# Patient Record
Sex: Male | Born: 1965 | Marital: Married | Smoking: Never smoker
Health system: Southern US, Community
[De-identification: ages and names within clinical notes are randomized; demographics above are authoritative.]

---

## 2012-08-09 ENCOUNTER — Ambulatory Visit
Admission: RE | Admit: 2012-08-09 | Discharge: 2012-08-09 | Disposition: A | Discharge status: Home / Self Care | Payer: No Typology Code available for payment source | Source: Ambulatory Visit | Attending: Infectious Diseases | Admitting: Infectious Diseases

## 2012-08-09 ENCOUNTER — Other Ambulatory Visit: Payer: Self-pay | Admitting: Infectious Diseases

## 2012-08-09 DIAGNOSIS — R7611 Nonspecific reaction to tuberculin skin test without active tuberculosis: Secondary | ICD-10-CM

## 2014-01-25 IMAGING — CR DG CHEST 1V
1 series · 1 of 1 positions shown · non-contrast
Comparison: None.

CLINICAL DATA: Positive TB skin test.

CHEST - 1 VIEW

[view not recorded]
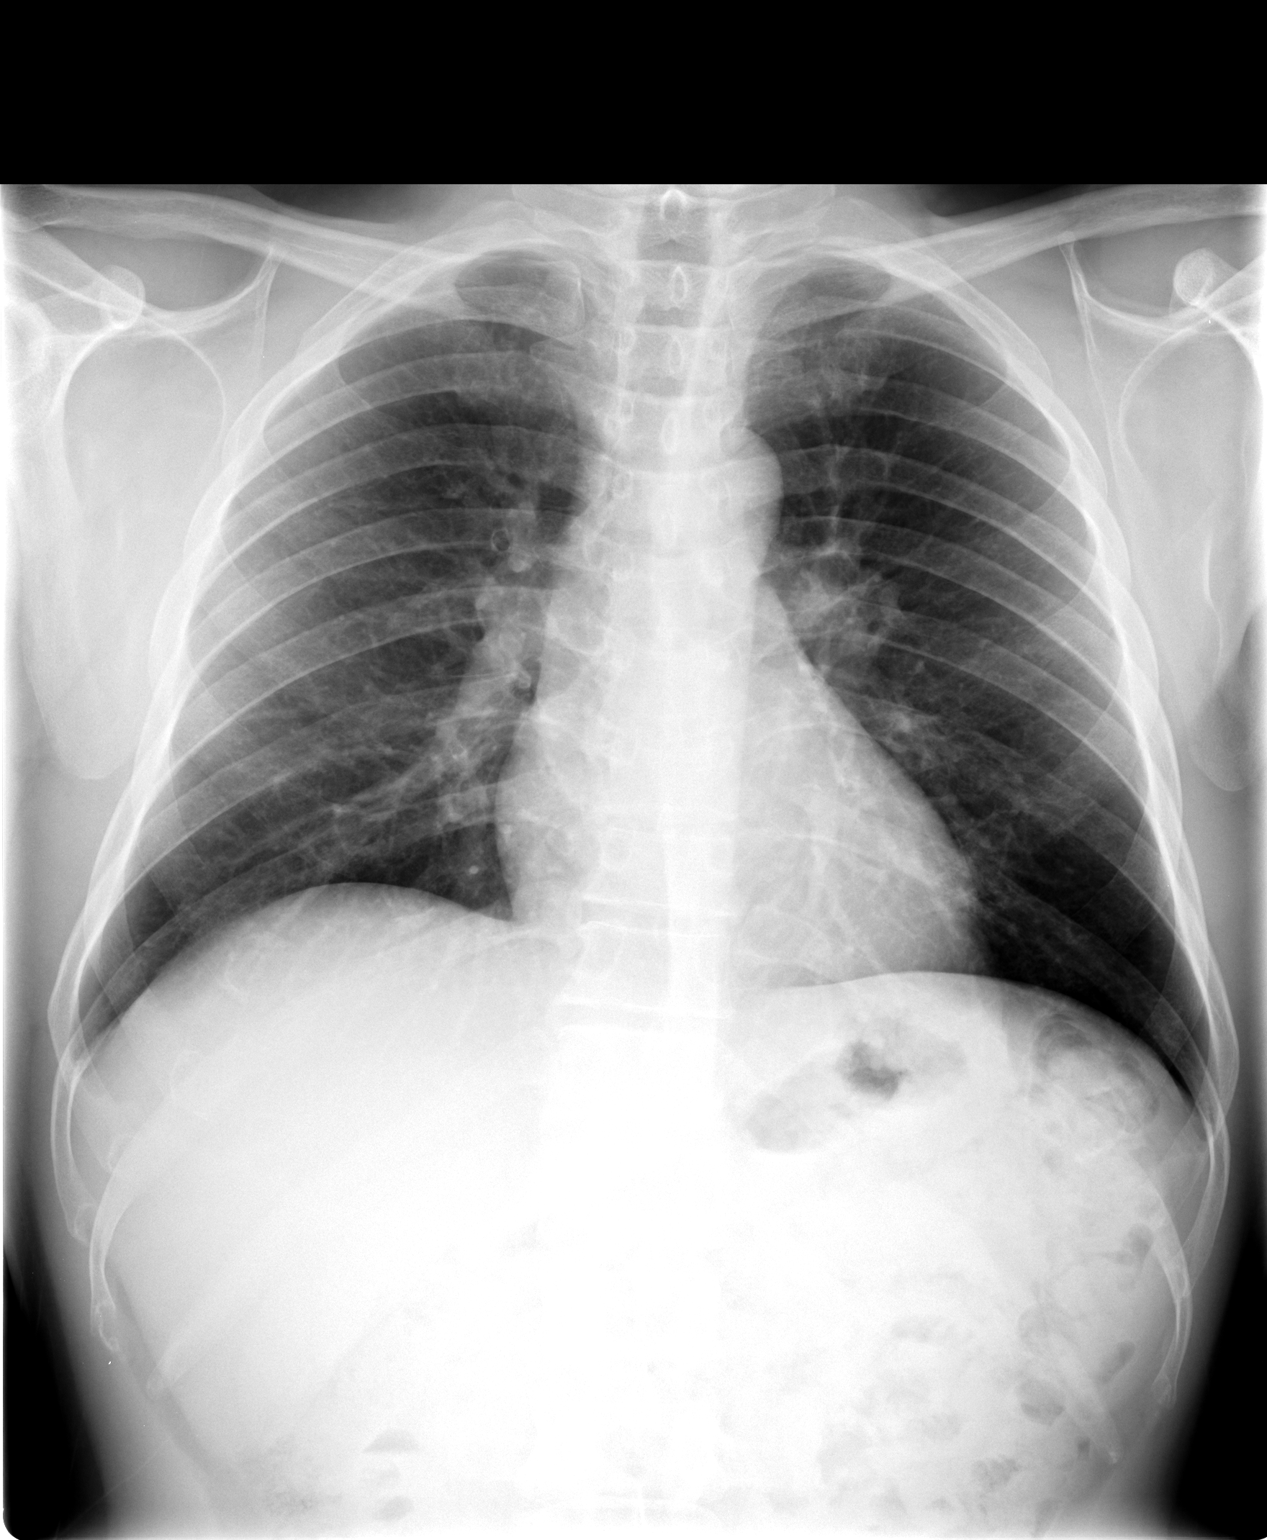

[1 of 1 positions shown; findings below may reference images not displayed]

FINDINGS: Trachea is midline.  Heart size normal.  Lungs are clear.
No pleural fluid.
IMPRESSION: No acute findings.  No evidence of active tuberculosis.

## 2014-04-01 ENCOUNTER — Ambulatory Visit (INDEPENDENT_AMBULATORY_CARE_PROVIDER_SITE_OTHER): Payer: BLUE CROSS/BLUE SHIELD | Admitting: Family Medicine

## 2014-04-01 VITALS — BP 110/84 | HR 73 | Temp 97.7°F | Ht 63.0 in | Wt 141.0 lb

## 2014-04-01 DIAGNOSIS — Z13220 Encounter for screening for lipoid disorders: Secondary | ICD-10-CM

## 2014-04-01 DIAGNOSIS — Z125 Encounter for screening for malignant neoplasm of prostate: Secondary | ICD-10-CM

## 2014-04-01 DIAGNOSIS — Z23 Encounter for immunization: Secondary | ICD-10-CM

## 2014-04-01 DIAGNOSIS — J028 Acute pharyngitis due to other specified organisms: Secondary | ICD-10-CM

## 2014-04-01 DIAGNOSIS — R059 Cough, unspecified: Secondary | ICD-10-CM

## 2014-04-01 DIAGNOSIS — Z0000 Encounter for general adult medical examination without abnormal findings: Secondary | ICD-10-CM

## 2014-04-01 DIAGNOSIS — R05 Cough: Secondary | ICD-10-CM

## 2014-04-01 DIAGNOSIS — Z130 Encounter for screening for diseases of the blood and blood-forming organs and certain disorders involving the immune mechanism: Secondary | ICD-10-CM

## 2014-04-01 DIAGNOSIS — R748 Abnormal levels of other serum enzymes: Secondary | ICD-10-CM

## 2014-04-01 LAB — COMPREHENSIVE METABOLIC PANEL
ALBUMIN: 4.3 g/dL (ref 3.5–5.2)
ALK PHOS: 52 U/L (ref 39–117)
ALT: 26 U/L (ref 0–53)
AST: 16 U/L (ref 0–37)
BUN: 17 mg/dL (ref 6–23)
CHLORIDE: 105 meq/L (ref 96–112)
CO2: 27 meq/L (ref 19–32)
CREATININE: 0.72 mg/dL (ref 0.50–1.35)
Calcium: 9.6 mg/dL (ref 8.4–10.5)
Glucose, Bld: 103 mg/dL — ABNORMAL HIGH (ref 70–99)
POTASSIUM: 4.2 meq/L (ref 3.5–5.3)
Sodium: 140 mEq/L (ref 135–145)
TOTAL PROTEIN: 7.5 g/dL (ref 6.0–8.3)
Total Bilirubin: 0.6 mg/dL (ref 0.2–1.2)

## 2014-04-01 LAB — LIPID PANEL
CHOL/HDL RATIO: 3.4 ratio
Cholesterol: 185 mg/dL (ref 0–200)
HDL: 54 mg/dL (ref >39)
LDL Cholesterol: 114 mg/dL — ABNORMAL HIGH (ref 0–99)
TRIGLYCERIDES: 84 mg/dL (ref <150)
VLDL: 17 mg/dL (ref 0–40)

## 2014-04-01 LAB — CBC
HCT: 49.4 % (ref 39.0–52.0)
Hemoglobin: 17.2 g/dL — ABNORMAL HIGH (ref 13.0–17.0)
MCH: 28.4 pg (ref 26.0–34.0)
MCHC: 34.8 g/dL (ref 30.0–36.0)
MCV: 81.5 fL (ref 78.0–100.0)
MPV: 9 fL (ref 8.6–12.4)
Platelets: 314 10*3/uL (ref 150–400)
RBC: 6.06 MIL/uL — AB (ref 4.22–5.81)
RDW: 13.3 % (ref 11.5–15.5)
WBC: 6.9 10*3/uL (ref 4.0–10.5)

## 2014-04-01 MED ORDER — HYDROCODONE-HOMATROPINE 5-1.5 MG/5ML PO SYRP: 5.0000 mL | ORAL_SOLUTION | Freq: Three times a day (TID) | ORAL | Status: DC | PRN

## 2014-04-01 NOTE — Progress Notes (Signed)
[facility] [address] [place] [address] [phone]  Date:  04/01/2014   Name:  Keith Martin   DOB:  Jul 11, 1965   MRN:  [id number]  PCP:  No primary care provider on file.    Chief Complaint: Annual Exam; Cough; and Sore Throat   History of Present Illness:  Keith Martin is a 49 y.o. very pleasant male patient who presents with the following:  Here today as a new patient seeking a CPE and also with complaint of sore throat. He is fasting today for labs.  He has noted a mild sore throat and cough for about 3 days. No fever.  The cough is bothersome because he has to leave his bedroom to avoid disturbing his sleeping bed partner He is generally in good health.   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  He does not do a lot of exercise in the winter, but his job is active He has kept up with his physical exams elsewhere in the past and reports that he has had elevated liver function in the past. He is not aware of having had a hepatitis screening.   He did have a colonoscopy per Dr. Elnoria Howard at age 56 due to bleeding; he recently received a reminder to come in and have a repeat colon Tetanus shot done approx 2008  There are no active problems to display for this patient.   History reviewed. No pertinent past medical history.  History reviewed. No pertinent past surgical history.  History  Substance Use Topics  . Smoking status: Never Smoker   . Smokeless tobacco: Not on file  . Alcohol Use: Not on file    History reviewed. No pertinent family history.  Allergies not on file  Medication list has been reviewed and updated.  No current outpatient prescriptions on file prior to visit.   No current facility-administered medications on file prior to visit.    Review of Systems:  As per HPI- otherwise negative.   Physical Examination: Filed Vitals:   04/01/14 1033  BP: 110/84  Pulse: 73  Temp: 97.7 F (36.5 C)   Filed Vitals:   04/01/14  1033  Height:  (1.6 m)  Weight: 141 lb (63.957 kg)   Body mass index is 24.98 kg/(m^2). Ideal Body Weight: Weight in (lb) to have BMI = 25: 140.8  GEN: WDWN, NAD, Non-toxic, A & O x 3 HEENT: Atraumatic, Normocephalic. Neck supple. No masses, No LAD.  Bilateral TM wnl, oropharynx shows a tiny ulcer on the posterior oropharynx, nasal cavity is congested.  PEERL,EOMI.   Ears and Nose: No external deformity. CV: RRR, No M/G/R. No JVD. No thrill. No extra heart sounds. PULM: CTA B, no wheezes, crackles, rhonchi. No retractions. No resp. distress. No accessory muscle use. ABD: S, NT, ND. No rebound. No HSM. EXTR: No c/c/e NEURO Normal gait.  PSYCH: Normally interactive. Conversant. Not depressed or anxious appearing.  Calm demeanor.  Normal testes, penis and DRE   Assessment and Plan: Physical exam  Acute pharyngitis due to other specified organisms  Cough - Plan: HYDROcodone-homatropine (HYCODAN) 5-1.5 MG/5ML syrup  Immunization due - Plan: Flu Vaccine QUAD 36+ mos IM  Screening for prostate cancer - Plan: PSA  Screening for hyperlipidemia - Plan: Lipid panel  Screening for deficiency anemia - Plan: CBC  Liver enzyme elevation - Plan: Comprehensive metabolic panel, Acute Hep Panel & Hep B Surface Ab  New patient here today for a CPE.  Appears to be in good health.   Treat for his viral cough/ ST with OTC medications and hycodan as needed.  Asked to let me know if not better in a few days Will plan further follow- up pending labs.   Signed Abbe AmsterdamJessica Dariah Mcsorley, MD

## 2014-04-01 NOTE — Patient Instructions (Addendum)
Good to see you today!  I will be in touch with your labs.   I think that you have a viral sore throat/ upper respiratory infection. Use over the counter medications as needed You can also use the cough syrup as needed but remember it can make you feel sleepy- do not use when you are driving We gave you your flu shot today I would recommend that you go ahead and get a follow-up colonoscopy in the next year or so  Influenza Vaccine (Flu Vaccine, Inactivated or Recombinant) 2014-2015: What You Need to Know 1. Why get vaccinated? Influenza ("flu") is a contagious disease that spreads around the [place] every winter, usually between October and May. Flu is caused by influenza viruses, and is spread mainly by coughing, sneezing, and close contact. Anyone can get flu, but the risk of getting flu is highest among children. Symptoms come on suddenly and may last several days. They can include:  fever/chills  sore throat  muscle aches  fatigue  cough  headache  runny or stuffy nose Flu can make some people much sicker than others. These people include young children, people 48 and older, pregnant women, and people with certain health conditions-such as heart, lung or kidney disease, nervous system disorders, or a weakened immune system. Flu vaccination is especially important for these people, and anyone in close contact with them. Flu can also lead to pneumonia, and make existing medical conditions worse. It can cause diarrhea and seizures in children. Each year thousands of people in the [place] die from flu, and many more are hospitalized. Flu vaccine is the best protection against flu and its complications. Flu vaccine also helps prevent spreading flu from person to person. 2. Inactivated and recombinant flu vaccines You are getting an injectable flu vaccine, which is either an "inactivated" or "recombinant" vaccine. These vaccines do not contain any live influenza virus. They  are given by injection with a needle, and often called the "flu shot."  A different live, attenuated (weakened) influenza vaccine is sprayed into the nostrils. This vaccine is described in a separate Vaccine Information Statement. Flu vaccination is recommended every year. Some children 6 months through 34 years of age might need two doses during one year. Flu viruses are always changing. Each year's flu vaccine is made to protect against 3 or 4 viruses that are likely to cause disease that year. Flu vaccine cannot prevent all cases of flu, but it is the best defense against the disease.  It takes about 2 weeks for protection to develop after the vaccination, and protection lasts several months to a year. Some illnesses that are not caused by influenza virus are often mistaken for flu. Flu vaccine will not prevent these illnesses. It can only prevent influenza. Some inactivated flu vaccine contains a very small amount of a mercury-based preservative called thimerosal. Studies have shown that thimerosal in vaccines is not harmful, but flu vaccines that do not contain a preservative are available. 3. Some people should not get this vaccine Tell the person who gives you the vaccine:  If you have any severe, life-threatening allergies. If you ever had a life-threatening allergic reaction after a dose of flu vaccine, or have a severe allergy to any part of this vaccine, including (for example) an allergy to gelatin, antibiotics, or eggs, you may be advised not to get vaccinated. Most, but not all, types of flu vaccine contain a small amount of egg protein.  If you ever had Guillain-Barr  Syndrome (a severe paralyzing illness, also called GBS). Some people with a history of GBS should not get this vaccine. This should be discussed with your doctor.  If you are not feeling well. It is usually okay to get flu vaccine when you have a mild illness, but you might be advised to wait until you feel better. You  should come back when you are better. 4. Risks of a vaccine reaction With a vaccine, like any medicine, there is a chance of side effects. These are usually mild and go away on their own. Problems that could happen after any vaccine:  Brief fainting spells can happen after any medical procedure, including vaccination. Sitting or lying down for about 15 minutes can help prevent fainting, and injuries caused by a fall. Tell your doctor if you feel dizzy, or have vision changes or ringing in the ears.  Severe shoulder pain and reduced range of motion in the arm where a shot was given can happen, very rarely, after a vaccination.  Severe allergic reactions from a vaccine are very rare, estimated at less than 1 in a million doses. If one were to occur, it would usually be within a few minutes to a few hours after the vaccination. Mild problems following inactivated flu vaccine:  soreness, redness, or swelling where the shot was given  hoarseness  sore, red or itchy eyes  cough  fever  aches  headache  itching  fatigue If these problems occur, they usually begin soon after the shot and last 1 or 2 days. Moderate problems following inactivated flu vaccine:  Young children who get inactivated flu vaccine and pneumococcal vaccine (PCV13) at the same time may be at increased risk for seizures caused by fever. Ask your doctor for more information. Tell your doctor if a child who is getting flu vaccine has ever had a seizure. Inactivated flu vaccine does not contain live flu virus, so you cannot get the flu from this vaccine. As with any medicine, there is a very remote chance of a vaccine causing a serious injury or death. The safety of vaccines is always being monitored. For more information, visit: http://floyd.org/www.cdc.gov/vaccinesafety/ 5. What if there is a serious reaction? What should I look for?  Look for anything that concerns you, such as signs of a severe allergic reaction, very high fever,  or behavior changes. Signs of a severe allergic reaction can include hives, swelling of the face and throat, difficulty breathing, a fast heartbeat, dizziness, and weakness. These would start a few minutes to a few hours after the vaccination. What should I do?  If you think it is a severe allergic reaction or other emergency that can't wait, call 9-1-1 and get the person to the nearest hospital. Otherwise, call your doctor.  Afterward, the reaction should be reported to the Vaccine Adverse Event Reporting System (VAERS). Your doctor should file this report, or you can do it yourself through the VAERS web site at www.vaers.LAgents.nohhs.gov, or by calling 1-(647) 209-9980. VAERS does not give medical advice. 6. The National Vaccine Injury Compensation Program The [employer] Vaccine Injury Compensation Program (VICP) is a federal program that was created to compensate people who may have been injured by certain vaccines. Persons who believe they may have been injured by a vaccine can learn about the program and about filing a claim by calling 1-725-538-7278 or visiting the VICP website at SpiritualWord.atwww.hrsa.gov/vaccinecompensation. There is a time limit to file a claim for compensation. 7. How can I learn more?  Ask your health care provider.  Call your local or state health department.  Contact the Centers for Disease Control and Prevention (CDC):  Call 530-048-6652 (1-800-CDC-INFO) or  Visit CDC's website at https://gibson.com/ CDC Vaccine Information Statement (Interim) Inactivated Influenza Vaccine (10/05/2012) Document Released: 11/28/2005 Document Revised: 06/20/2013 Document Reviewed: 01/21/2013 [facility] Patient Information 2015 [place]. This information is not intended to replace advice given to you by your health care provider. Make sure you discuss any questions you have with your health care provider.

## 2014-04-02 LAB — ACUTE HEP PANEL AND HEP B SURFACE AB
HCV Ab: NEGATIVE
HEP B S AB: POSITIVE — AB
HEP B S AG: NEGATIVE
Hep A IgM: NONREACTIVE
Hep B C IgM: NONREACTIVE

## 2014-04-03 LAB — PSA: PSA: 1.14 ng/mL (ref <=4.00)

## 2014-04-09 ENCOUNTER — Encounter: Payer: Self-pay | Admitting: Family Medicine

## 2015-06-23 ENCOUNTER — Ambulatory Visit (INDEPENDENT_AMBULATORY_CARE_PROVIDER_SITE_OTHER): Payer: BLUE CROSS/BLUE SHIELD | Admitting: Physician Assistant

## 2015-06-23 ENCOUNTER — Ambulatory Visit (INDEPENDENT_AMBULATORY_CARE_PROVIDER_SITE_OTHER): Payer: BLUE CROSS/BLUE SHIELD

## 2015-06-23 VITALS — BP 110/72 | HR 71 | Temp 98.0°F | Resp 16 | Ht 63.0 in | Wt 137.6 lb

## 2015-06-23 DIAGNOSIS — H9193 Unspecified hearing loss, bilateral: Secondary | ICD-10-CM | POA: Diagnosis not present

## 2015-06-23 DIAGNOSIS — M25511 Pain in right shoulder: Secondary | ICD-10-CM | POA: Diagnosis not present

## 2015-06-23 DIAGNOSIS — H9313 Tinnitus, bilateral: Secondary | ICD-10-CM

## 2015-06-23 DIAGNOSIS — S4362XA Sprain of left sternoclavicular joint, initial encounter: Secondary | ICD-10-CM

## 2015-06-23 MED ORDER — DICLOFENAC SODIUM 75 MG PO TBEC: 75.0000 mg | DELAYED_RELEASE_TABLET | Freq: Two times a day (BID) | ORAL | Status: AC

## 2015-06-23 NOTE — Progress Notes (Signed)
Nehemiah SettleDavid Hing  MRN: [id number] DOB: [birth year]  Subjective:  Pt presents to clinic 2 complaints 1- right ear tinnitus and decreased hearing in that ear for about 2 months - he has used the ear wax removal drops at home and his symptoms continue.  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  2- left shoulder and clavicle pain for about 2 weeks - he lifts heavy things at work and he is having pain with lifting.  He has some pain in his trapezius area after lifting a lot of heavy items.  He takes glucosamine chondroitin but it has not been helping this pain.  He has no paresthesias or pain down his left arm.  There are no active problems to display for this patient.   Current Outpatient Prescriptions on File Prior to Visit  Medication Sig Dispense Refill  . multivitamin-lutein (OCUVITE-LUTEIN) CAPS capsule Take 1 capsule by mouth daily.     No current facility-administered medications on file prior to visit.    No Known Allergies  Review of Systems  Constitutional: Negative for fever and chills.  HENT: Positive for hearing loss (1-2 months ago). Negative for ear discharge, ear pain and sore throat.   Respiratory: Negative for cough.   Allergic/Immunologic: Negative for environmental allergies.   Objective:  BP 110/72 mmHg  Pulse 71  Temp(Src) 98 F (36.7 C) (Oral)  Resp 16  Ht 5\' 3"  (1.6 m)  Wt 137 lb 9.6 oz (62.415 kg)  BMI 24.38 kg/m2  SpO2 97%  Physical Exam  Constitutional: He is oriented to person, place, and time and well-developed, well-nourished, and in no distress.  HENT:  Head: Normocephalic and atraumatic.  Right Ear: Hearing, tympanic membrane, external ear and ear canal normal.  Left Ear: Hearing, tympanic membrane, external ear and ear canal normal.  Nose: Nose normal.  Mouth/Throat: Uvula is midline, oropharynx is clear and moist and mucous membranes are  normal.  Fahim Kats - R=L Rinne - AC>BC B  Eyes: Conjunctivae are normal.  Neck: Normal range of motion.  Cardiovascular: Normal rate, regular rhythm and normal heart sounds.   Pulmonary/Chest: Effort normal and breath sounds normal. He has no wheezes.  Musculoskeletal:       Right shoulder: Normal.       Left shoulder: He exhibits tenderness ([place] joint TTP), pain and spasm (trapezius spasm with some TTP). He exhibits normal range of motion.  Lymphadenopathy:       Head (right side): No tonsillar adenopathy present.       Head (left side): No tonsillar adenopathy present.    He has no cervical adenopathy.       Right: No supraclavicular adenopathy present.       Left: No supraclavicular adenopathy present.  Neurological: He is alert and oriented to person, place, and time. Gait normal.  Skin: Skin is warm and dry.  Psychiatric: Mood, memory, affect and judgment normal.   [facility]  06/23/2015  CLINICAL DATA:  Left [place] joint pain for several weeks. EXAM: STENOCLAVICULAR JOINTS - 3+ VIEW COMPARISON:  None. FINDINGS: The sternoclavicular joints appear symmetric bilaterally. No destructive bony changes or erosive findings. No significant spurring changes. The visualized lungs are clear. IMPRESSION: Unremarkable plain film examination of the sternoclavicular joints. If symptoms persist MR may be helpful to evaluate for [place] joint arthropathy. Electronically Signed   By: Demetrius CharityP.  Gallerani M.D.   On: 06/23/2015 16:48    Assessment and Plan :  Hearing loss, bilateral - Plan: Ambulatory referral to Audiology - his hearing tests today were normal so he may have loud noise hearing loss bilaterally leading to tinnitus - we will have audiology evaluate the patient   Tinnitus, bilateral - Plan: Ambulatory referral to Audiology  Clavicle pain, right - Plan: [facility] [place] [facility]  Sternoclavicular (joint) (ligament) sprain, left, initial encounter - Plan: diclofenac (VOLTAREN) 75 MG EC tablet - this is likely from  lifting at work and with ice and NSAIDs this should resolve.  He will recheck if he has not improved in the next 2 weeks.  Benny Lennert PA-C  [facility] 06/23/2015 4:51 PM

## 2015-06-23 NOTE — Patient Instructions (Addendum)
  Ice to the area that hurts and use the medications that I gave you for a week and then just as needed.  They will call you with an appt for you hearing loss and ringing in your ears.   IF you received an x-ray today, you will receive an invoice from [facility]. Please contact [facility] at [phone] with questions or concerns regarding your invoice.   IF you received labwork today, you will receive an invoice from [facility]. Please contact [facility] at [phone] with questions or concerns regarding your invoice.   Our billing staff will not be able to assist you with questions regarding bills from these companies.  You will be contacted with the lab results as soon as they are available. The fastest way to get your results is to activate your My Chart account. Instructions are located on the last page of this paperwork. If you have not heard from us regarding the results in 2 weeks, please contact this office.

## 2015-07-26 ENCOUNTER — Encounter: Payer: Self-pay | Admitting: Physician Assistant

## 2015-07-26 DIAGNOSIS — H9193 Unspecified hearing loss, bilateral: Secondary | ICD-10-CM | POA: Insufficient documentation

## 2016-05-23 ENCOUNTER — Encounter: Payer: Self-pay | Admitting: Physician Assistant

## 2016-06-11 ENCOUNTER — Encounter: Payer: Self-pay | Admitting: Physician Assistant

## 2016-06-11 DIAGNOSIS — K635 Polyp of colon: Secondary | ICD-10-CM | POA: Insufficient documentation

## 2016-12-08 IMAGING — CR DG SC JOINTS 3+V
3 series · 3 of 3 positions shown · non-contrast
Comparison: None.

CLINICAL DATA: Left [place] joint pain for several weeks.

EXAM:
STENOCLAVICULAR JOINTS - 3+ VIEW

[PA]
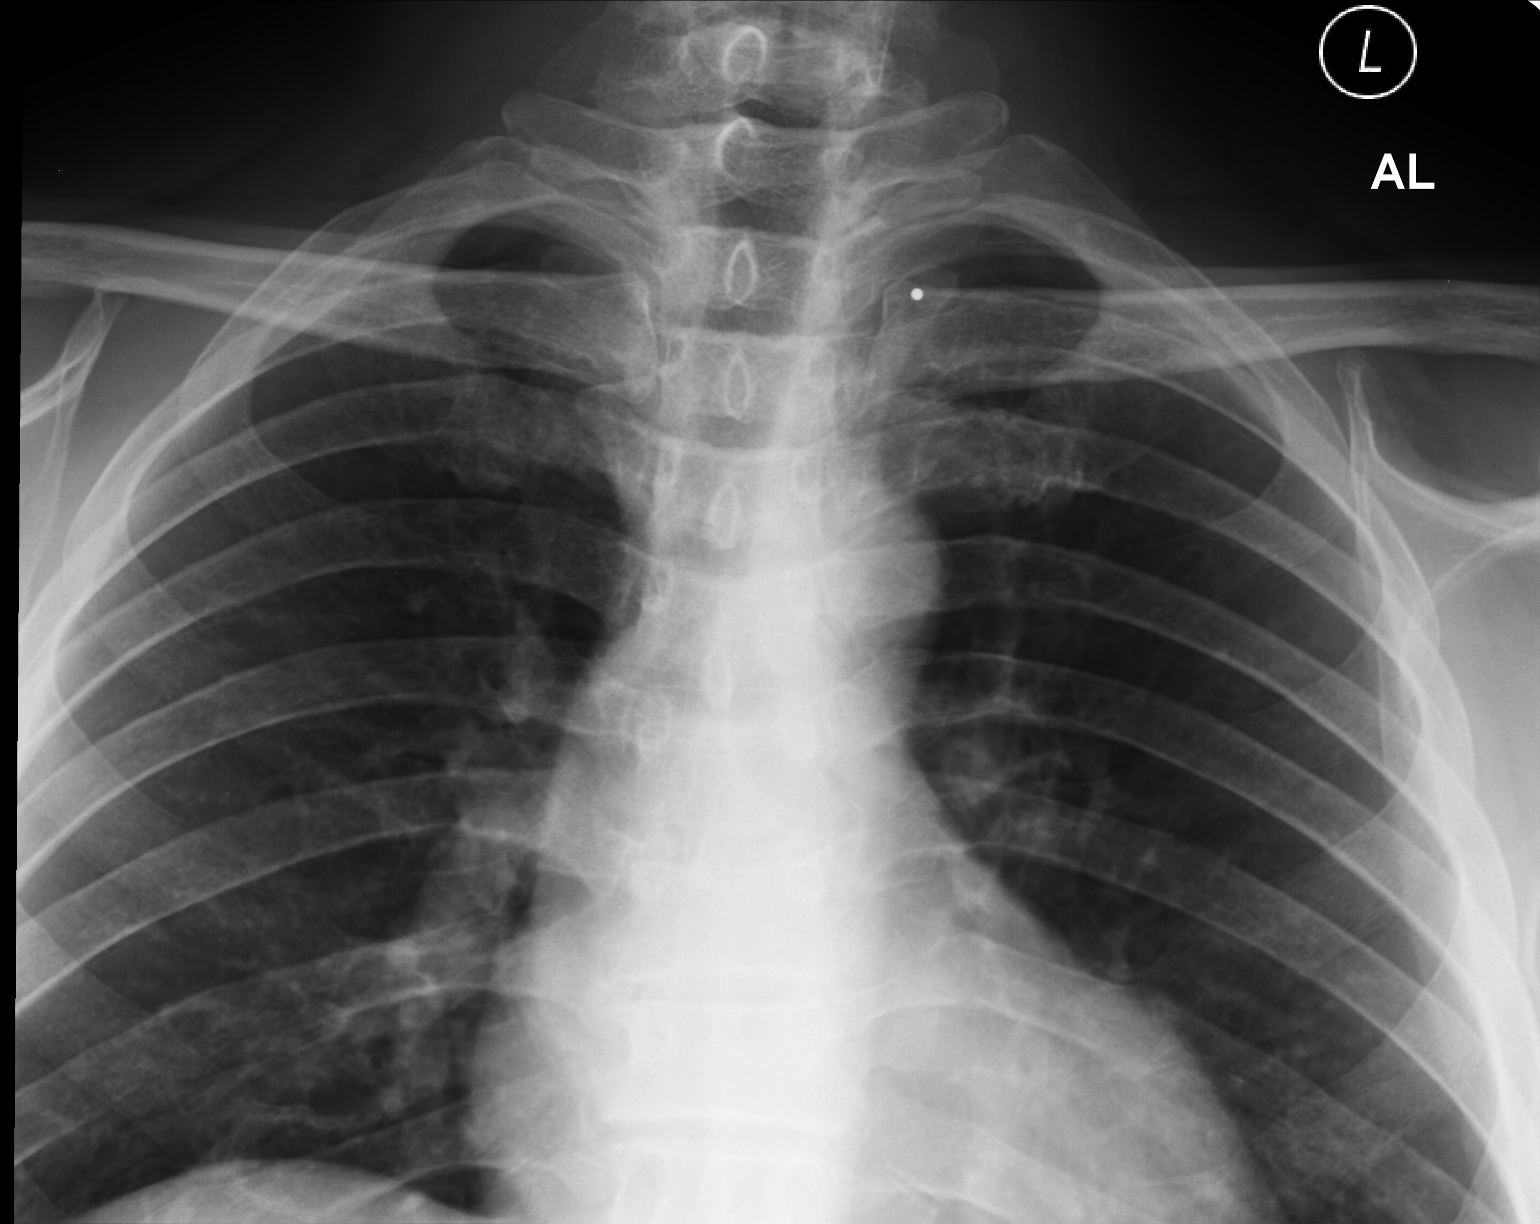

[rao]
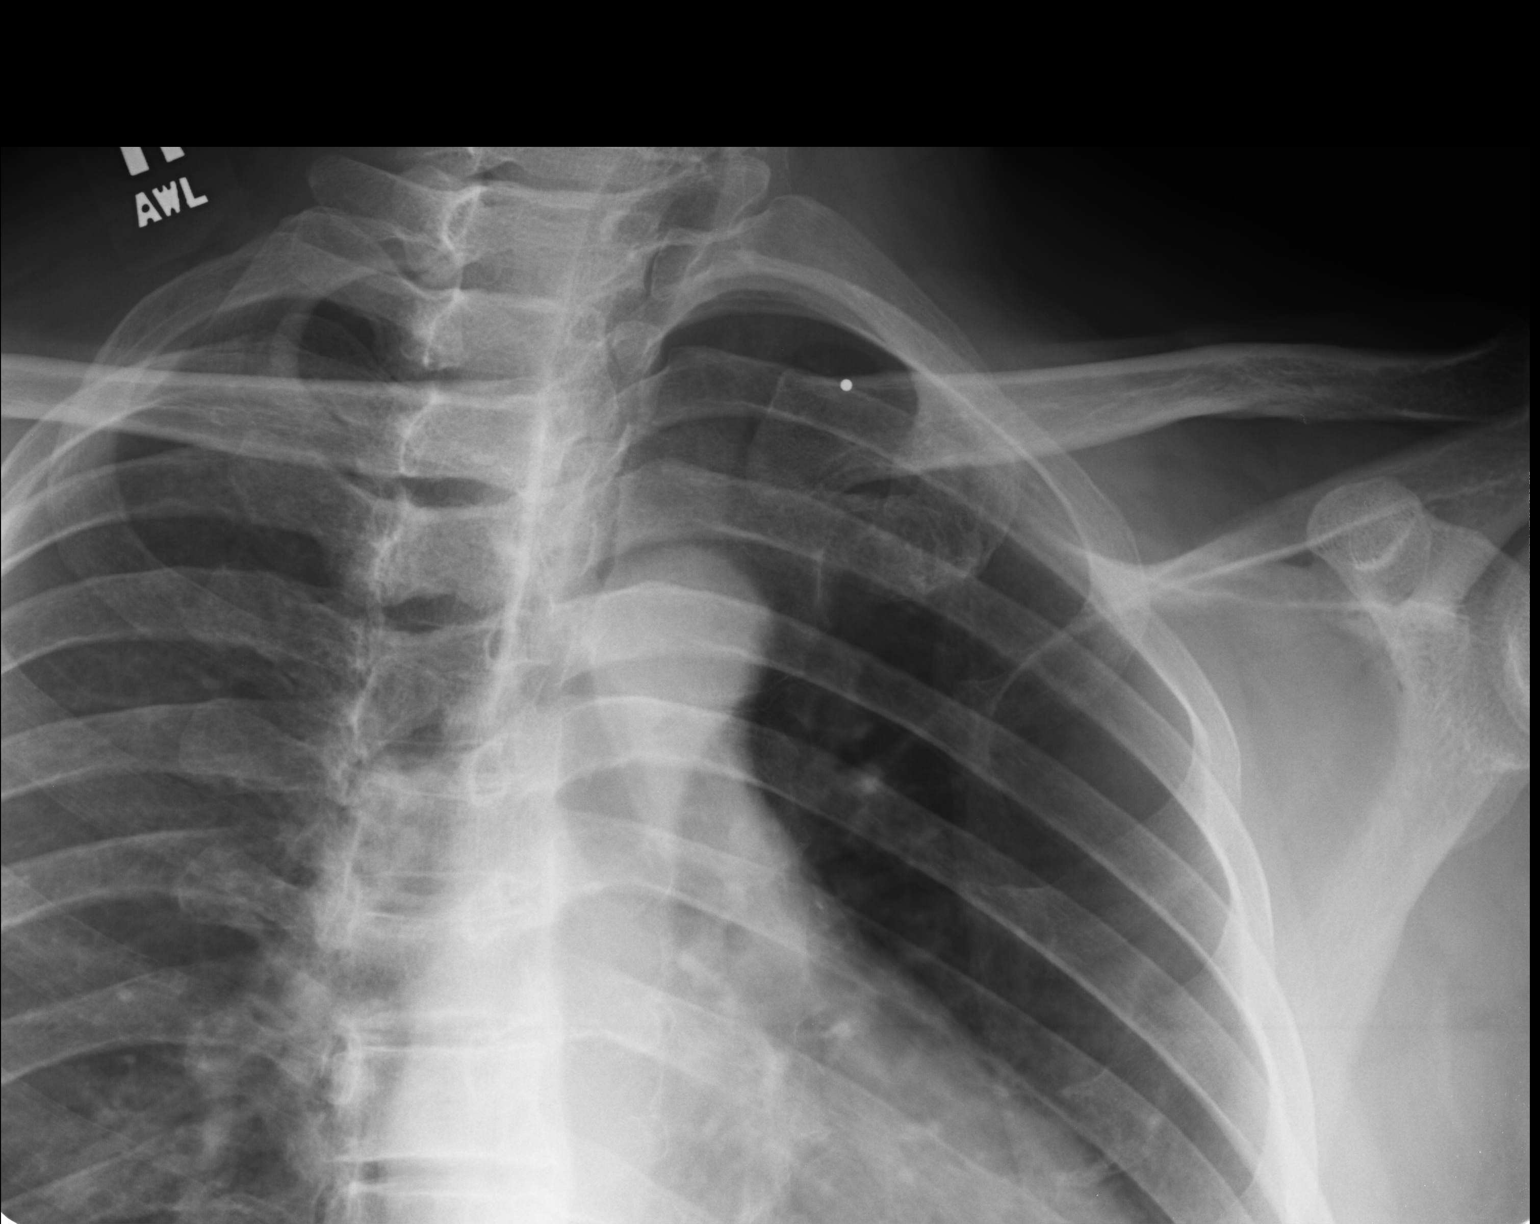

[lao]
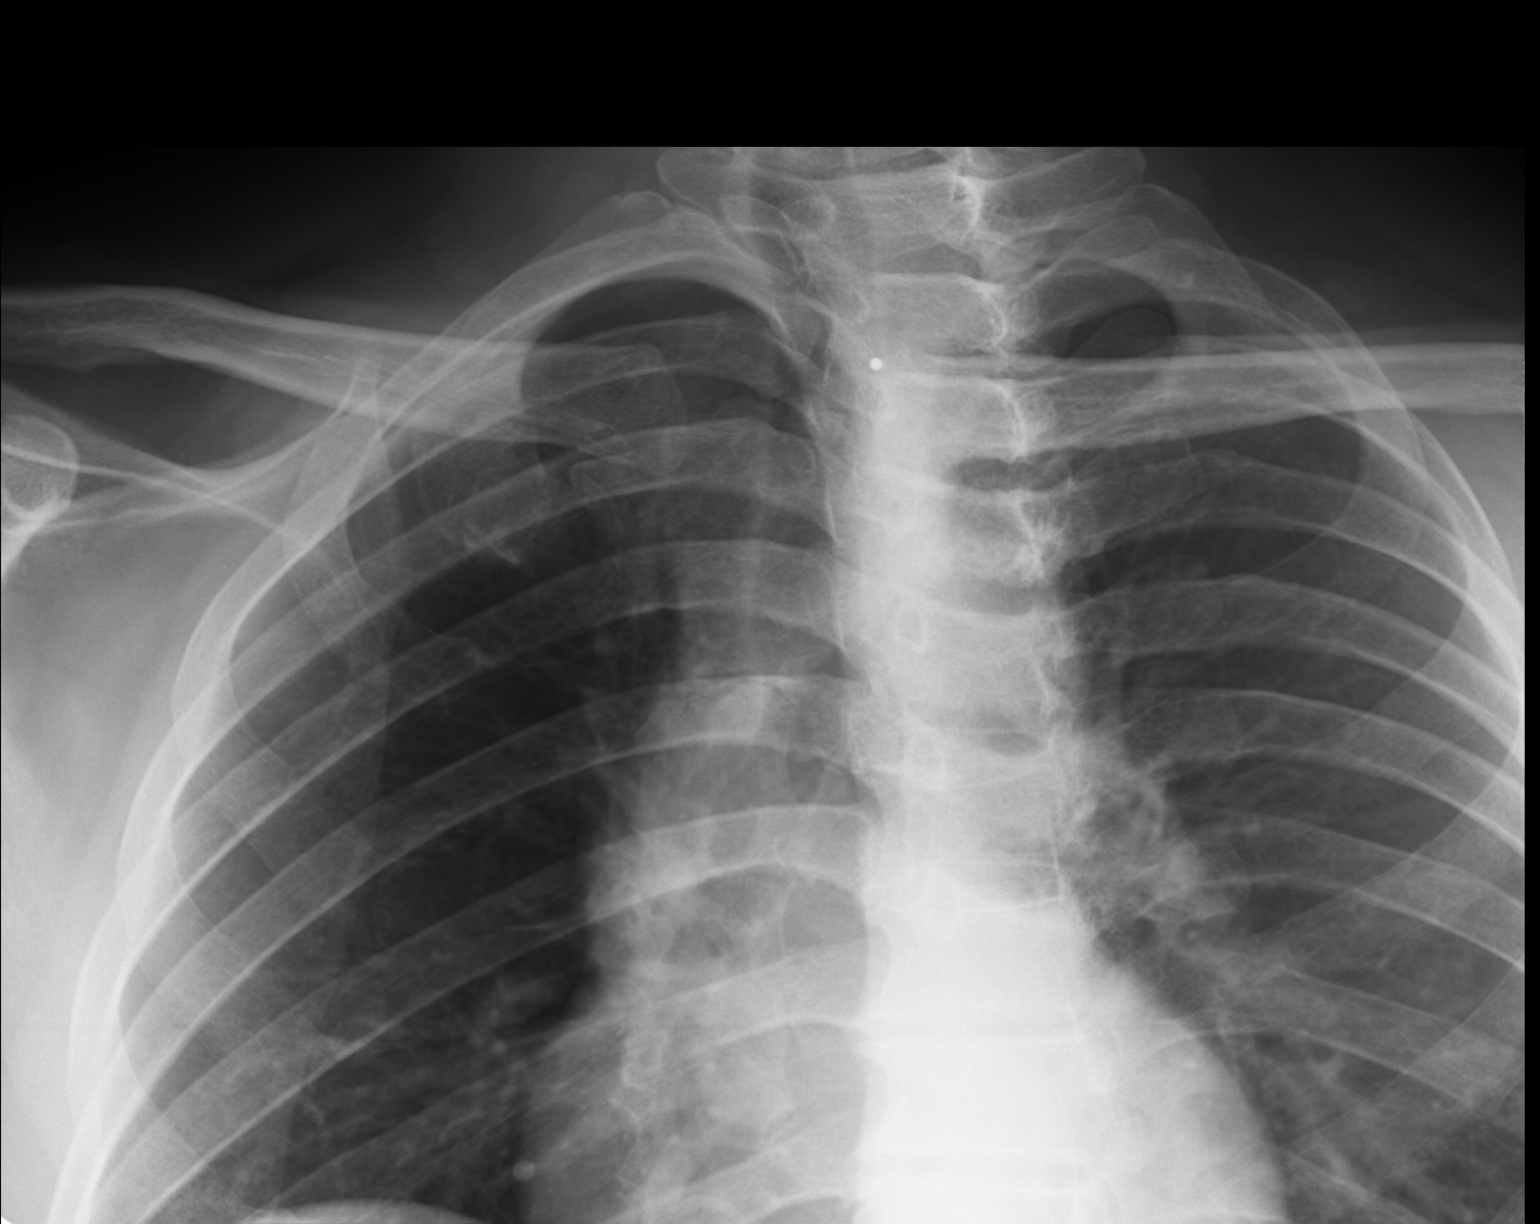

[3 of 3 positions shown; findings below may reference images not displayed]

FINDINGS: The sternoclavicular joints appear symmetric bilaterally. No
destructive bony changes or erosive findings. No significant
spurring changes. The visualized lungs are clear.
IMPRESSION: Unremarkable plain film examination of the sternoclavicular joints.
If symptoms persist MR [place] be helpful to evaluate for [place] joint
arthropathy.

## 2019-04-30 ENCOUNTER — Ambulatory Visit: Payer: Self-pay | Attending: Internal Medicine
